# Patient Record
Sex: Female | Born: 1990 | Race: White | Hispanic: No | Marital: Single | Smoking: Never smoker
Health system: Southern US, Community
[De-identification: ages and names within clinical notes are randomized; demographics above are authoritative.]

## PROBLEM LIST (undated history)

## (undated) DIAGNOSIS — T7840XA Allergy, unspecified, initial encounter: Secondary | ICD-10-CM

## (undated) HISTORY — DX: Allergy, unspecified, initial encounter: T78.40XA

---

## 2015-04-12 ENCOUNTER — Ambulatory Visit (INDEPENDENT_AMBULATORY_CARE_PROVIDER_SITE_OTHER): Payer: BLUE CROSS/BLUE SHIELD | Admitting: Family Medicine

## 2015-04-12 ENCOUNTER — Ambulatory Visit (INDEPENDENT_AMBULATORY_CARE_PROVIDER_SITE_OTHER): Payer: BLUE CROSS/BLUE SHIELD

## 2015-04-12 VITALS — BP 102/62 | HR 74 | Temp 99.0°F | Resp 18 | Ht 69.0 in | Wt 152.0 lb

## 2015-04-12 DIAGNOSIS — M79672 Pain in left foot: Secondary | ICD-10-CM

## 2015-04-12 NOTE — Patient Instructions (Signed)
Continue to avoid running and other activities that cause pain Ice and ibuprofen may help  You have an appt this Wednesday 9/21 at [facility] ortho at 3:30 pm- please arrive at 3 for paperwork You will see Dr. Evaristo Bury [address] [place] [address] Office: [phone]

## 2015-04-12 NOTE — Progress Notes (Signed)
[facility] [address], [place] [address] [phone]  Date:  04/12/2015   Name:  Valerie Meyer   DOB:  11-06-1990   MRN:  [id number]  PCP:  No PCP Per Patient    Chief Complaint: Foot Injury   History of Present Illness:  Valerie Meyer is a 24 y.o. very pleasant female patient who presents with the following:  She has noted pain in her left foot for the past 10 days.  She has been training for a 1/2 marathon which is next month- although she has always been a runner she has increased her mileage as of late.  She notes that her foot hurts when she walks, and even more if she runs. She has been avoiding running over the last few days.  Worried that she may be getting a stress fracture The foot does not hurt at rest, but does hurt when she puts her weight on it  She is generally in good health No known injury Never had a stress fracture in the past  There are no active problems to display for this patient.   Past Medical History  Diagnosis Date  . Allergy     History reviewed. No pertinent past surgical history.  Social History  Substance Use Topics  . Smoking status: Never Smoker   . Smokeless tobacco: None  . Alcohol Use: 0.0 - 1.8 oz/week    0-3 Standard drinks or equivalent per week    History reviewed. No pertinent family history.  No Known Allergies  Medication list has been reviewed and updated.  No current outpatient prescriptions on file prior to visit.   No current facility-administered medications on file prior to visit.    Review of Systems:  As per HPI- otherwise negative.   Physical Examination: Filed Vitals:   04/12/15 1459  BP: 102/62  Pulse: 74  Temp: 99 F (37.2 C)  Resp: 18   Filed Vitals:   04/12/15 1459  Height:  (1.753 m)  Weight: 152 lb (68.947 kg)   Body mass index is 22.44 kg/(m^2). Ideal Body Weight: Weight in (lb) to have BMI = 25: 168.9  GEN: WDWN, NAD, Non-toxic, A & O x 3 HEENT:  Atraumatic, Normocephalic. Neck supple. No masses, No LAD. Ears and Nose: No external deformity. CV: RRR, No M/G/R. No JVD. No thrill. No extra heart sounds. PULM: CTA B, no wheezes, crackles, rhonchi. No retractions. No resp. distress. No accessory muscle use. EXTR: No c/c/e NEURO Normal gait.  PSYCH: Normally interactive. Conversant. Not depressed or anxious appearing.  Calm demeanor.  Left foot: she has minimal to no tenderness in the dorsum of her foot over the 3rd and 4th MT.   No pain with vibration from tuning fork. No swelling, heat or redness  [facility] reading (PRIMARY) by  Dr. Patsy Lager Left foot: negative  LEFT FOOT - COMPLETE 3+ VIEW  COMPARISON: None.  FINDINGS: There is no evidence of fracture or dislocation. There is no evidence of arthropathy or other focal bone abnormality. Soft tissues are unremarkable.  IMPRESSION: Normal exam. Specifically, no evidence of stress fracture.  Assessment and Plan: Foot pain, left - Plan: DG Foot Complete Left, Ambulatory referral to Orthopedic Surgery  Placed in a post-op shoe which did feel better to her.  She will continue relative rest, no running. Made her an appt with sports medicine  Signed Abbe Amsterdam, MD  [facility], [place] Wednesday 9/21 at 3:30 pm, arrive at 3pm- pt aware  Fax to [phone]

## 2017-05-14 IMAGING — CR DG FOOT COMPLETE 3+V*L*
3 series · 3 of 3 positions shown · non-contrast
Comparison: None.

CLINICAL DATA: Foot pain.  The patient is a runner.

EXAM:
LEFT FOOT - COMPLETE 3+ VIEW

[AP]
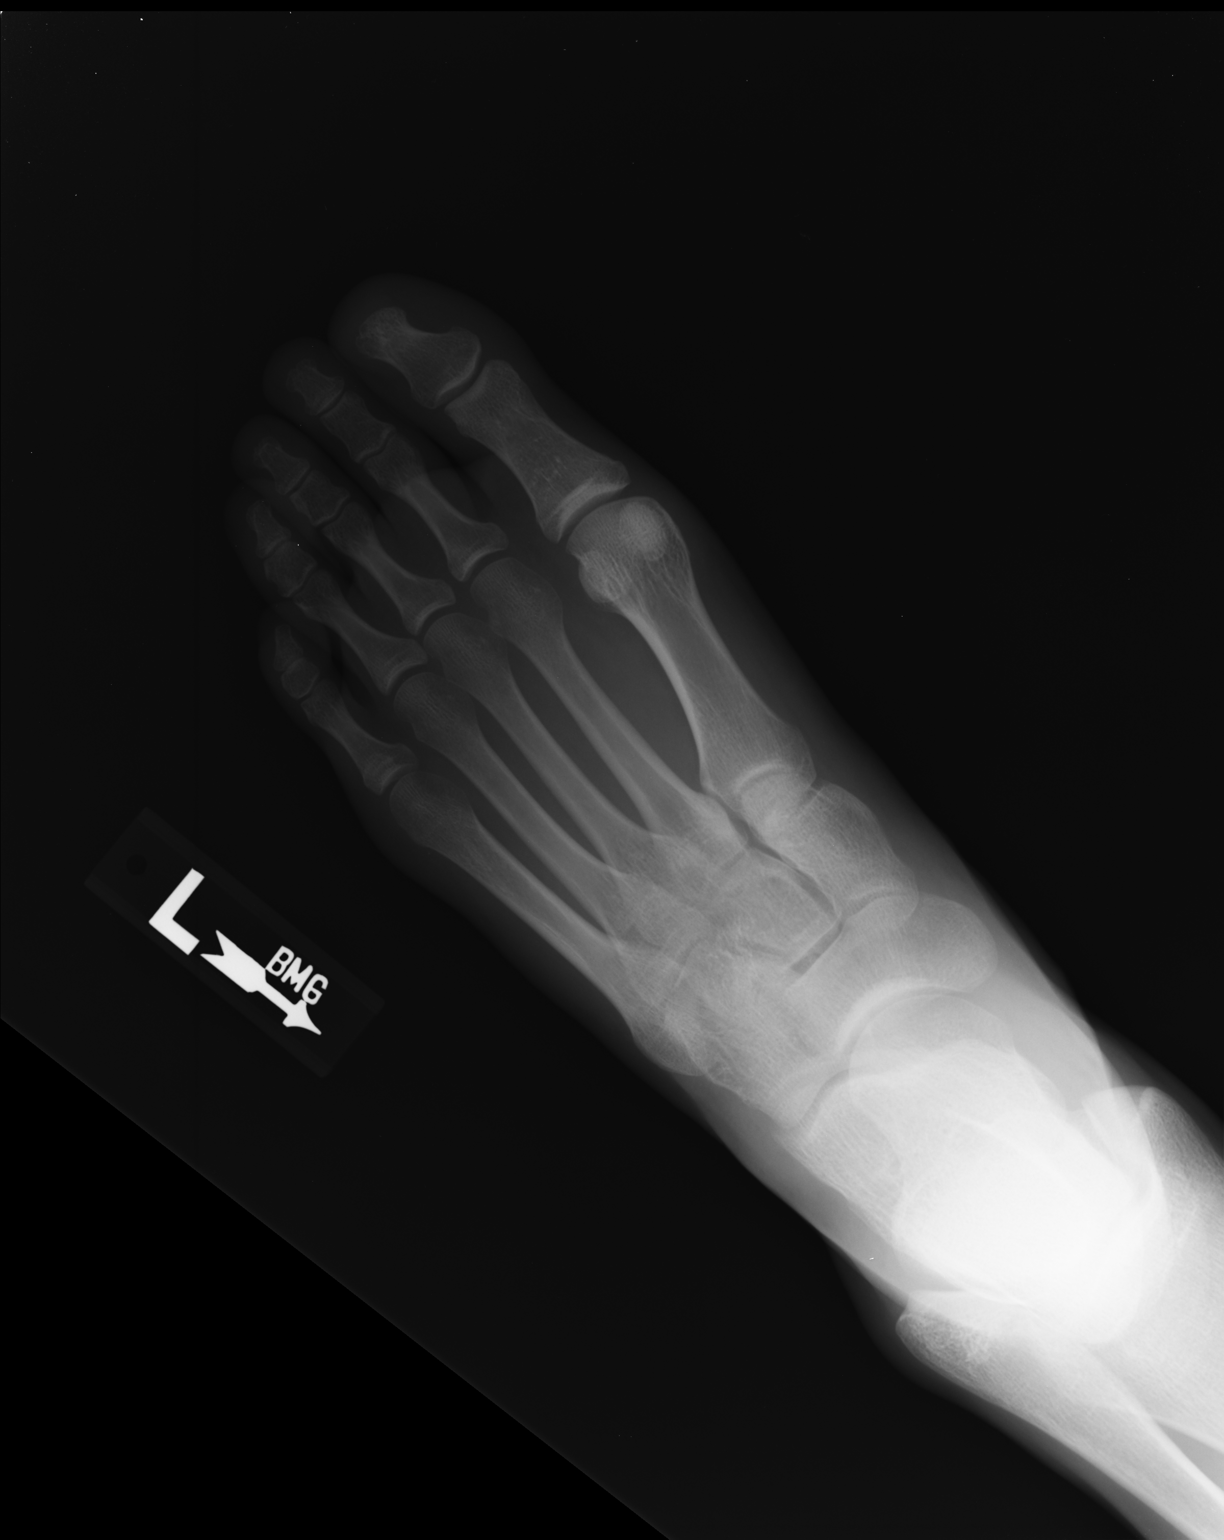

[ap obl int rot]
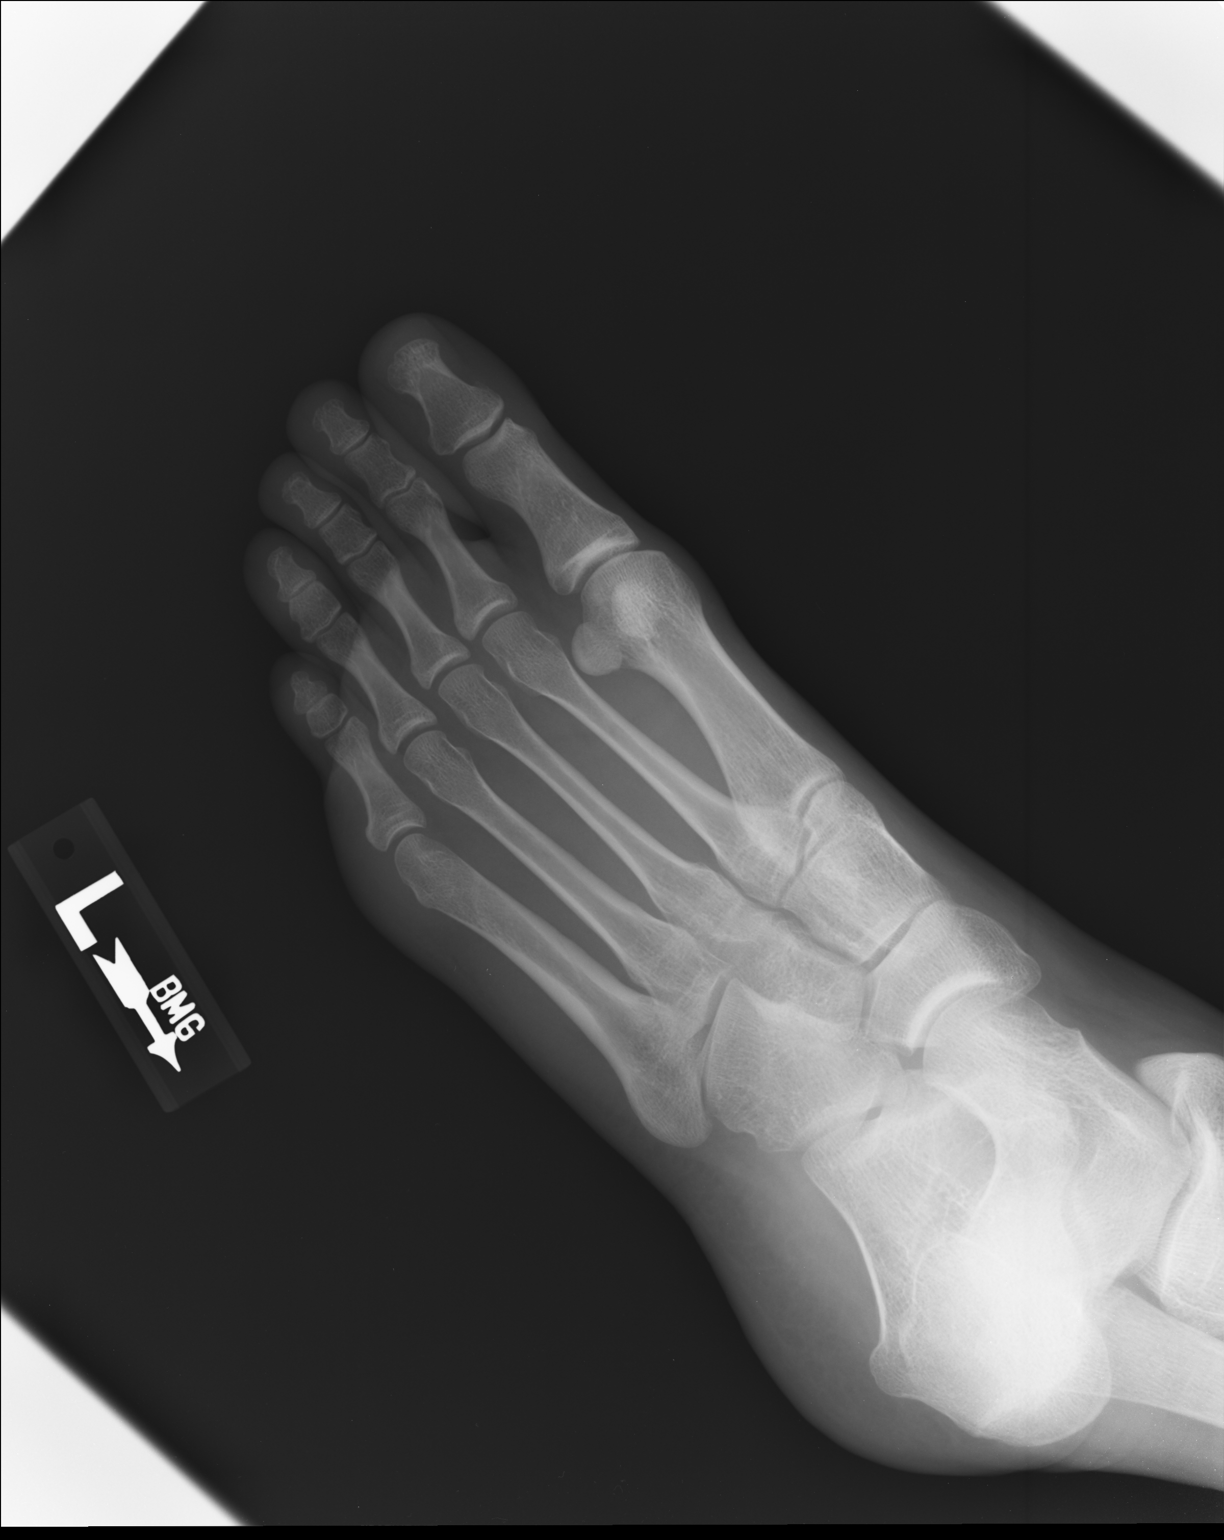

[lateral]
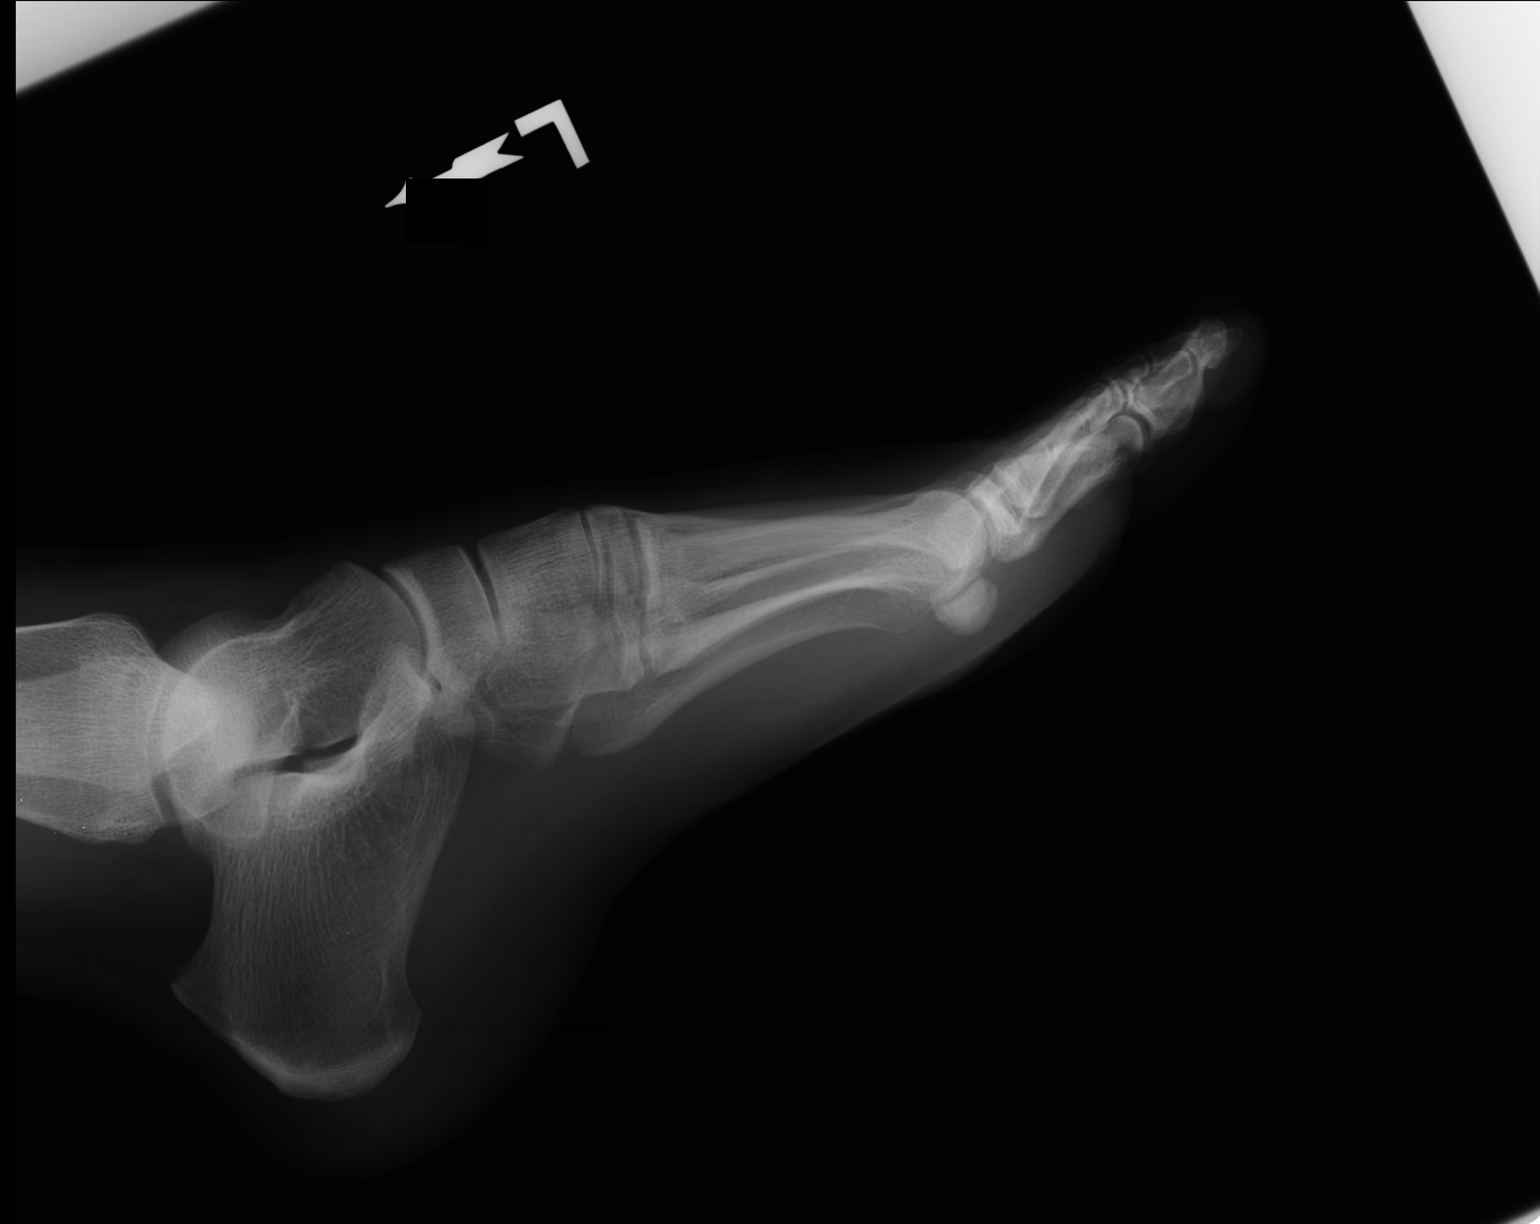

[3 of 3 positions shown; findings below may reference images not displayed]

FINDINGS: There is no evidence of fracture or dislocation. There is no
evidence of arthropathy or other focal bone abnormality. Soft
tissues are unremarkable.
IMPRESSION: Normal exam.  Specifically, no evidence of stress fracture.
